# Patient Record
Sex: Male | Born: 1980 | Race: Black or African American | Hispanic: No | Marital: Single | Smoking: Former smoker
Health system: Southern US, Community
[De-identification: ages and names within clinical notes are randomized; demographics above are authoritative.]

## PROBLEM LIST (undated history)

## (undated) DIAGNOSIS — Z8669 Personal history of other diseases of the nervous system and sense organs: Secondary | ICD-10-CM

## (undated) DIAGNOSIS — Z8619 Personal history of other infectious and parasitic diseases: Secondary | ICD-10-CM

## (undated) DIAGNOSIS — K589 Irritable bowel syndrome without diarrhea: Secondary | ICD-10-CM

## (undated) HISTORY — DX: Irritable bowel syndrome without diarrhea: K58.9

## (undated) HISTORY — DX: Personal history of other diseases of the nervous system and sense organs: Z86.69

## (undated) HISTORY — DX: Personal history of other infectious and parasitic diseases: Z86.19

---

## 1998-07-23 ENCOUNTER — Emergency Department (HOSPITAL_COMMUNITY): Admission: EM | Admit: 1998-07-23 | Discharge: 1998-07-24 | Payer: Self-pay | Admitting: Emergency Medicine

## 1998-07-23 ENCOUNTER — Encounter: Payer: Self-pay | Admitting: Emergency Medicine

## 2000-03-09 ENCOUNTER — Encounter: Payer: Self-pay | Admitting: Emergency Medicine

## 2000-03-09 ENCOUNTER — Emergency Department (HOSPITAL_COMMUNITY): Admission: EM | Admit: 2000-03-09 | Discharge: 2000-03-10 | Payer: Self-pay | Admitting: Emergency Medicine

## 2000-03-18 ENCOUNTER — Emergency Department (HOSPITAL_COMMUNITY): Admission: EM | Admit: 2000-03-18 | Discharge: 2000-03-18 | Payer: Self-pay | Admitting: Emergency Medicine

## 2004-05-16 ENCOUNTER — Ambulatory Visit: Payer: Self-pay | Admitting: Internal Medicine

## 2004-06-12 ENCOUNTER — Ambulatory Visit: Payer: Self-pay | Admitting: Internal Medicine

## 2004-07-16 ENCOUNTER — Encounter: Admission: RE | Admit: 2004-07-16 | Discharge: 2004-07-16 | Payer: Self-pay | Admitting: Internal Medicine

## 2006-02-03 ENCOUNTER — Ambulatory Visit: Payer: Self-pay | Admitting: Internal Medicine

## 2010-05-19 ENCOUNTER — Encounter: Payer: Self-pay | Admitting: Internal Medicine

## 2010-05-20 ENCOUNTER — Encounter: Payer: Self-pay | Admitting: Internal Medicine

## 2010-05-20 ENCOUNTER — Ambulatory Visit (INDEPENDENT_AMBULATORY_CARE_PROVIDER_SITE_OTHER): Payer: 59 | Admitting: Internal Medicine

## 2010-05-20 VITALS — BP 110/70 | HR 72 | Ht 69.25 in | Wt 146.0 lb

## 2010-05-20 DIAGNOSIS — R209 Unspecified disturbances of skin sensation: Secondary | ICD-10-CM

## 2010-05-20 DIAGNOSIS — K589 Irritable bowel syndrome without diarrhea: Secondary | ICD-10-CM

## 2010-05-20 DIAGNOSIS — R200 Anesthesia of skin: Secondary | ICD-10-CM

## 2010-05-20 NOTE — Patient Instructions (Signed)
I think your symptoms could have been from compression of the stockings on  your legs down and crossing  Your legs over time.   I recommend wearing low socks that cannot compress your left leg overweight crossing her legs and putting pressure on the nerve.   If it is not continuing to get better over the next 2-3 weeks call and we may reevaluate.   If your swelling increases again despite elevating her legs at night also call or if you have redness or severe pain. Otherwise activity as normal.

## 2010-05-30 ENCOUNTER — Encounter: Payer: Self-pay | Admitting: Internal Medicine

## 2010-05-30 DIAGNOSIS — K589 Irritable bowel syndrome without diarrhea: Secondary | ICD-10-CM | POA: Insufficient documentation

## 2010-05-30 DIAGNOSIS — R200 Anesthesia of skin: Secondary | ICD-10-CM | POA: Insufficient documentation

## 2010-05-30 NOTE — Assessment & Plan Note (Signed)
This is possibly from local compression stockings and leg crossing no evidence of systemic neurologic disease or vascular obstruction at this time.

## 2010-05-30 NOTE — Progress Notes (Signed)
  Subjective:    Patient ID: Garrett Carlson, male    DOB: April 19, 1980, 30 y.o.   MRN: [id number]  HPI Patient comes in today to reestablish for an acute problem. His last visit with [place] was over six years ago.  He has generally been well and not had the need for  medical care. However over the last number of weeks he has had a problem with his left leg that he describes as tingling feeling tight mostly below his knee on the lateral side going down to his foot. There is no redness no specific injury noted. He has no history of blood clots and they do not run in his family. He has noticed that with his stockings on after work at times there is a band of fact that he can see on his skin. He's able to play basketball no specific injuries.  Sometimes when he is working sitting at station he does cross his legs a good bit. He has a history of intentional weight loss.   Review of Systems Negative for hearing vision chest pain shortness of breath he does have a history somehow takes an occasional mid-upper stomach ache from time to time that was diagnosed as IBS in the past he had used AcipHex for this to. History of migraines when he was younger none recently. Past Medical History  Diagnosis Date  . IBS (irritable bowel syndrome)   . Hx of varicella   . Hx of migraines    History reviewed. No pertinent past surgical history.  reports that he has quit smoking. He does not have any smokeless tobacco history on file. He reports that he does not drink alcohol or use illicit drugs. family history includes Arthritis in an unspecified family member and Breast cancer in an unspecified family member. No Known Allergies     Objective:   Physical Exam Well-developed well-nourished healthy appearing young adult no acute distress H EENT is grossly normal Neck no masses  Chest : CTA CV s1 s2 no g o m No clubbing cyanosis or edema  Extr: No focal atrophy or obvious edema he states left calf feels a bit  tight no rashes normal gait niche is good range of motion without fusion. No bony tenderness. Feet are normal without deformity. Gait  within normal limits        Assessment & Plan:  Left leg tingling Possibly compression of the anterior lateral  nerve when he crosses his legs with the feeling of swelling. He may have slight dependent edema at the end of the day. We discussed possibility of getting a Doppler to rule out venous clotting but I doubt if this is the problem  Disc avoiding leg crossing and compression in this area and see how this does .  He should fu if  persistent or progressive .   Or if new sx come on.  GI HX of IBS   Monitor and fu if  Recurring more frequently

## 2011-06-29 ENCOUNTER — Ambulatory Visit (INDEPENDENT_AMBULATORY_CARE_PROVIDER_SITE_OTHER): Payer: 59 | Admitting: Internal Medicine

## 2011-06-29 ENCOUNTER — Encounter: Payer: Self-pay | Admitting: Internal Medicine

## 2011-06-29 VITALS — BP 118/70 | HR 77 | Temp 97.7°F | Wt 153.0 lb

## 2011-06-29 DIAGNOSIS — M549 Dorsalgia, unspecified: Secondary | ICD-10-CM

## 2011-06-29 DIAGNOSIS — Z113 Encounter for screening for infections with a predominantly sexual mode of transmission: Secondary | ICD-10-CM

## 2011-06-29 DIAGNOSIS — Z202 Contact with and (suspected) exposure to infections with a predominantly sexual mode of transmission: Secondary | ICD-10-CM

## 2011-06-29 LAB — POCT URINALYSIS DIPSTICK
Bilirubin, UA: NEGATIVE
Glucose, UA: NEGATIVE
Ketones, UA: NEGATIVE
Leukocytes, UA: NEGATIVE
Nitrite, UA: NEGATIVE
Protein, UA: NEGATIVE
Spec Grav, UA: 1.02
Urobilinogen, UA: 0.2
pH, UA: 6.5

## 2011-06-29 MED ORDER — METRONIDAZOLE 500 MG PO TABS: 2000.0000 mg | ORAL_TABLET | Freq: Once | ORAL | Status: AC

## 2011-06-29 NOTE — Progress Notes (Signed)
  Subjective:    Patient ID: Garrett Carlson, male    DOB: 02-26-1980, 31 y.o.   MRN: [id number]  HPI Patient comes in today  for  new problem evaluation. Onset with awalening  Some LBP and then increasing slowly and last week slipped pushing a cart and aggravated it.  Then laid down and resting it.  And stretch at times.  NO hx or same   Except last year on back  [place] [employer].  4 hours at a time.  Split shift.  Spot sleep.  NO fever rash hematouria   Also concern about parter" having something and told  to get checked"  On other questioning this may be trichomonas  By hs he doesn't have uti sx or dc or hx of same and no other partners   Review of Systems No cp sob fever uti sx abd pain see hpi  No weakness or numbness now  iBS sx better?  Past history family history social history reviewed in the electronic medical record. No outpatient prescriptions prior to visit.       Objective:   Physical Exam BP 118/70  Pulse 77  Temp(Src) 97.7 F (36.5 C) (Oral)  Wt 153 lb (69.4 kg)  SpO2 98% WDWN in and HEENT grossly normal Neck: Supple without adenopathy or masses or bruits Chest:  Clear to A&P without wheezes rales or rhonchi CV:  S1-S2 no gallops or murmurs peripheral perfusion is normal BACK no midline tenderness  Para lumbar muscle area with mild spasm . Neg slr le weakness or rash . Toe heel walk is normal rom pain with over 90 degree flexion  Not extension.   SeeUA    Assessment & Plan:  Back pain  Low seems mechanical but hx context and exam without other alarm features   Disc  Exercises and rx and to fu if  persistent or progressive etc.  Screen sti exposure   Counseled. About all screening  Including blood tests for now will just do GC CHL and trich if available but  Even without sx  partners should be rx empirically so med given today with explanation and risk of getting again if reexposure to untreated person. Pt seems to understand this  Total visit >  50% spent counseling and coordinating care

## 2011-06-29 NOTE — Patient Instructions (Addendum)
This seems like  A mechanical  back pain and strain.  Attentive good back hygiene avoid prolonged sitting heavy bending and lifting until it gets better. You can use the local topicals that you are using and 2 Aleve twice a day if needed.  Back exercises might help but don't do them if they make things hurts worse.   Take metronidazole in case you have a Trichomonas infection.  Would like to know when laboratory tests are back. Trichomoniasis Trichomoniasis is an infection, caused by the Trichomonas organism, that affects both women and men. In women, the outer male genitalia and the vagina are affected. In men, the penis is mainly affected, but the prostate and other reproductive organs can also be involved. Trichomoniasis is a sexually transmitted disease (STD) and is most often passed to another person through sexual contact. The majority of people who get trichomoniasis do so from a sexual encounter and are also at risk for other STDs. CAUSES   Sexual intercourse with an infected partner.   It can be present in swimming pools or hot tubs.  SYMPTOMS   Abnormal gray-green frothy vaginal discharge in women.   Vaginal itching and irritation in women.   Itching and irritation of the area outside the vagina in women.   Penile discharge with or without pain in males.   Inflammation of the urethra (urethritis), causing painful urination.   Bleeding after sexual intercourse.  RELATED COMPLICATIONS  Pelvic inflammatory disease.   Infection of the uterus (endometritis).   Infertility.   Tubal (ectopic) pregnancy.   It can be associated with other STDs, including gonorrhea and chlamydia, hepatitis B, and HIV.  COMPLICATIONS DURING PREGNANCY  Early (premature) delivery.   Premature rupture of the membranes (PROM).   Low birth weight.  DIAGNOSIS   Visualization of Trichomonas under the microscope from the vagina discharge.   Ph of the vagina greater than 4.5, tested with a  test tape.   Trich Rapid Test.   Culture of the organism, but this is not usually needed.   It may be found on a Pap test.   Having a "strawberry cervix,"which means the cervix looks very red like a strawberry.  TREATMENT   You may be given medication to fight the infection. Inform your caregiver if you could be or are pregnant. Some medications used to treat the infection should not be taken during pregnancy.   Over-the-counter medications or creams to decrease itching or irritation may be recommended.   Your sexual partner will need to be treated if infected.  HOME CARE INSTRUCTIONS   Take all medication prescribed by your caregiver.   Take over-the-counter medication for itching or irritation as directed by your caregiver.   Do not have sexual intercourse while you have the infection.   Do not douche or wear tampons.   Discuss your infection with your partner, as your partner may have acquired the infection from you. Or, your partner may have been the person who transmitted the infection to you.   Have your sex partner examined and treated if necessary.   Practice safe, informed, and protected sex.   See your caregiver for other STD testing.  SEEK MEDICAL CARE IF:   You still have symptoms after you finish the medication.   You have an oral temperature above 102 F (38.9 C).   You develop belly (abdominal) pain.   You have pain when you urinate.   You have bleeding after sexual intercourse.   You develop a  rash.   The medication makes you sick or makes you throw up (vomit).  Document Released: 08/05/2000 Document Revised: 01/29/2011 Document Reviewed: 08/31/2008 [facility] Patient Information 2012 [place], [place].  Back Exercises These exercises may help you when beginning to rehabilitate your injury. Your symptoms may resolve with or without further involvement from your physician, physical therapist or athletic trainer. While completing these exercises,  remember:   Restoring tissue flexibility helps normal motion to return to the joints. This allows healthier, less painful movement and activity.   An effective stretch should be held for at least 30 seconds.   A stretch should never be painful. You should only feel a gentle lengthening or release in the stretched tissue.  STRETCH - Extension, Prone on Elbows   Lie on your stomach on the floor, a bed will be too soft. Place your palms about shoulder width apart and at the height of your head.   Place your elbows under your shoulders. If this is too painful, stack pillows under your chest.   Allow your body to relax so that your hips drop lower and make contact more completely with the floor.   Hold this position for __________ seconds.   Slowly return to lying flat on the floor.  Repeat __________ times. Complete this exercise __________ times per day.  RANGE OF MOTION - Extension, Prone Press Ups   Lie on your stomach on the floor, a bed will be too soft. Place your palms about shoulder width apart and at the height of your head.   Keeping your back as relaxed as possible, slowly straighten your elbows while keeping your hips on the floor. You may adjust the placement of your hands to maximize your comfort. As you gain motion, your hands will come more underneath your shoulders.   Hold this position __________ seconds.   Slowly return to lying flat on the floor.  Repeat __________ times. Complete this exercise __________ times per day.  RANGE OF MOTION- Quadruped, Neutral Spine   Assume a hands and knees position on a firm surface. Keep your hands under your shoulders and your knees under your hips. You may place padding under your knees for comfort.   Drop your head and point your tail bone toward the ground below you. This will round out your low back like an angry cat. Hold this position for __________ seconds.   Slowly lift your head and release your tail bone so that your back  sags into a large arch, like an old horse.   Hold this position for __________ seconds.   Repeat this until you feel limber in your low back.   Now, find your "sweet spot." This will be the most comfortable position somewhere between the two previous positions. This is your neutral spine. Once you have found this position, tense your stomach muscles to support your low back.   Hold this position for __________ seconds.  Repeat __________ times. Complete this exercise __________ times per day.  STRETCH - Flexion, Single Knee to Chest   Lie on a firm bed or floor with both legs extended in front of you.   Keeping one leg in contact with the floor, bring your opposite knee to your chest. Hold your leg in place by either grabbing behind your thigh or at your knee.   Pull until you feel a gentle stretch in your low back. Hold __________ seconds.   Slowly release your grasp and repeat the exercise with the opposite side.  Repeat __________ times. Complete this exercise __________ times per day.  STRETCH - Hamstrings, Standing  Stand or sit and extend your right / left leg, placing your foot on a chair or foot stool   Keeping a slight arch in your low back and your hips straight forward.   Lead with your chest and lean forward at the waist until you feel a gentle stretch in the back of your right / left knee or thigh. (When done correctly, this exercise requires leaning only a small distance.)   Hold this position for __________ seconds.  Repeat __________ times. Complete this stretch __________ times per day. STRENGTHENING - Deep Abdominals, Pelvic Tilt   Lie on a firm bed or floor. Keeping your legs in front of you, bend your knees so they are both pointed toward the ceiling and your feet are flat on the floor.   Tense your lower abdominal muscles to press your low back into the floor. This motion will rotate your pelvis so that your tail bone is scooping upwards rather than pointing at  your feet or into the floor.   With a gentle tension and even breathing, hold this position for __________ seconds.  Repeat __________ times. Complete this exercise __________ times per day.  STRENGTHENING - Abdominals, Crunches   Lie on a firm bed or floor. Keeping your legs in front of you, bend your knees so they are both pointed toward the ceiling and your feet are flat on the floor. Cross your arms over your chest.   Slightly tip your chin down without bending your neck.   Tense your abdominals and slowly lift your trunk high enough to just clear your shoulder blades. Lifting higher can put excessive stress on the low back and does not further strengthen your abdominal muscles.   Control your return to the starting position.  Repeat __________ times. Complete this exercise __________ times per day.  STRENGTHENING - Quadruped, Opposite UE/LE Lift   Assume a hands and knees position on a firm surface. Keep your hands under your shoulders and your knees under your hips. You may place padding under your knees for comfort.   Find your neutral spine and gently tense your abdominal muscles so that you can maintain this position. Your shoulders and hips should form a rectangle that is parallel with the floor and is not twisted.   Keeping your trunk steady, lift your right hand no higher than your shoulder and then your left leg no higher than your hip. Make sure you are not holding your breath. Hold this position __________ seconds.   Continuing to keep your abdominal muscles tense and your back steady, slowly return to your starting position. Repeat with the opposite arm and leg.  Repeat __________ times. Complete this exercise __________ times per day. Document Released: 02/27/2005 Document Revised: 01/29/2011 Document Reviewed: 05/24/2008 [facility] Patient Information 2012 [place], [place].

## 2011-06-30 LAB — GC/CHLAMYDIA PROBE AMP, URINE
Chlamydia, Swab/Urine, PCR: NEGATIVE
GC Probe Amp, Urine: NEGATIVE

## 2011-07-04 DIAGNOSIS — Z202 Contact with and (suspected) exposure to infections with a predominantly sexual mode of transmission: Secondary | ICD-10-CM | POA: Insufficient documentation

## 2011-07-04 DIAGNOSIS — M549 Dorsalgia, unspecified: Secondary | ICD-10-CM | POA: Insufficient documentation

## 2011-07-04 DIAGNOSIS — Z113 Encounter for screening for infections with a predominantly sexual mode of transmission: Secondary | ICD-10-CM | POA: Insufficient documentation

## 2011-07-08 NOTE — Progress Notes (Signed)
Quick Note:  Spoke with pt- informed of results and dr. Rosezella [place] instructions . Pt is just picking up med today - encouraged him to take med today. Still waiting for 1 more resutls. Verbalized understanding . ______

## 2016-10-20 ENCOUNTER — Encounter: Payer: Self-pay | Admitting: Family Medicine

## 2016-10-20 ENCOUNTER — Ambulatory Visit (INDEPENDENT_AMBULATORY_CARE_PROVIDER_SITE_OTHER): Payer: BLUE CROSS/BLUE SHIELD | Admitting: Family Medicine

## 2016-10-20 VITALS — BP 118/70 | HR 61 | Resp 12 | Ht 69.25 in | Wt 150.5 lb

## 2016-10-20 DIAGNOSIS — R053 Chronic cough: Secondary | ICD-10-CM

## 2016-10-20 DIAGNOSIS — R05 Cough: Secondary | ICD-10-CM | POA: Diagnosis not present

## 2016-10-20 DIAGNOSIS — R51 Headache: Secondary | ICD-10-CM | POA: Diagnosis not present

## 2016-10-20 DIAGNOSIS — Z202 Contact with and (suspected) exposure to infections with a predominantly sexual mode of transmission: Secondary | ICD-10-CM | POA: Diagnosis not present

## 2016-10-20 DIAGNOSIS — M79645 Pain in left finger(s): Secondary | ICD-10-CM | POA: Diagnosis not present

## 2016-10-20 DIAGNOSIS — R519 Headache, unspecified: Secondary | ICD-10-CM

## 2016-10-20 DIAGNOSIS — J309 Allergic rhinitis, unspecified: Secondary | ICD-10-CM

## 2016-10-20 DIAGNOSIS — M654 Radial styloid tenosynovitis [de Quervain]: Secondary | ICD-10-CM

## 2016-10-20 MED ORDER — METRONIDAZOLE 500 MG PO TABS: 2000.0000 mg | ORAL_TABLET | Freq: Once | ORAL | 0 refills | Status: AC

## 2016-10-20 MED ORDER — MELOXICAM 7.5 MG PO TABS: 7.5000 mg | ORAL_TABLET | Freq: Every day | ORAL | 0 refills | Status: AC

## 2016-10-20 MED ORDER — FLUTICASONE PROPIONATE 50 MCG/ACT NA SUSP: 1.0000 | Freq: Two times a day (BID) | NASAL | 3 refills | Status: DC

## 2016-10-20 NOTE — Progress Notes (Signed)
HPI:   [name] is a 36 y.o. male, who is here today to establish care with me.  Former PCP: N/A Last preventive routine visit: 5-6 years ago.  Chronic medical problems: Chronic bilateral shoulder pain, frequent headaches, and migraines among some. He states that he has not had a migraine in many years. He lives with a friend. He follows a healthy diet but does not exercise regularly.   Concerns today:   He has a list of concerns, states that he has not seen a doctor in 5 years.  STD testing, according to patient, he was exposed to Trichomonas about 2-3 weeks ago. He denies any urethral discharge, dysuria, genital lesions, or abdominal pain. He doesn't wear condoms consistently. He is reporting HIV test done last year and negative.  Left finger pain and left wrist pain: Left middle finger pain for about a month, he denies any injury. Burning/sore pain, mild limitation of flexion, 7-8/10 and getting worse. He is right-handed. He has not tried OTC medication.  Left wrist pain for about 2 weeks,radial aspect, chart, 8/10. Pain is exacerbated by certain activities. This problem is otherwise stable.  When asked about associated fatigue, she states that he is "always tired."   Headache: Frontal pressure headache, which has been going on for a while. According to patient, in the past when he had this headache it is because he needed to change eyeglasses prescription. He has eye exam recently and wearing new glasses. This headache is mild. He denies associated nausea, vomiting, or orthopnea. He has history of migraines, has not had one in many years.   Allergies.  Currently he is not on treatment. According to patient, allergies have been "bad" year. Currently he is not having this congestion or rhinorrhea. He denies fever, chills, or body aches. He is not on treatment.  Coughing "for months". 02/22/17 stopped smoking. He denies associated chest pain,  wheezing, or dyspnea. He denies hemoptysis or abnormal weight loss. He has not tried OTC medication. He denies history of GERD or heartburn.   Review of Systems  Constitutional: Positive for fatigue. Negative for appetite change, chills, diaphoresis, fever and unexpected weight change.  HENT: Negative for dental problem, facial swelling, nosebleeds, rhinorrhea, sinus pain and sore throat.   Eyes: Negative for photophobia, pain and visual disturbance.  Respiratory: Positive for cough. Negative for shortness of breath and wheezing.   Cardiovascular: Negative for chest pain, palpitations and leg swelling.  Gastrointestinal: Negative for abdominal pain, nausea and vomiting.       No changes in bowel habits.  Genitourinary: Negative for decreased urine volume, discharge, dysuria, hematuria, penile swelling and testicular pain.  Musculoskeletal: Positive for arthralgias. Negative for gait problem and myalgias.  Skin: Negative for pallor and rash.  Neurological: Positive for headaches. Negative for seizures.  Hematological: Negative for adenopathy. Does not bruise/bleed easily.  Psychiatric/Behavioral: Negative for confusion and sleep disturbance. The patient is nervous/anxious.      No current outpatient prescriptions on file prior to visit.   No current facility-administered medications on file prior to visit.      Past Medical History:  Diagnosis Date  . Hx of migraines   . Hx of varicella   . IBS (irritable bowel syndrome)    No Known Allergies  Family History  Problem Relation Age of Onset  . Breast cancer Unknown   . Arthritis Unknown        grandmother- maternal  . Arthritis Maternal Grandmother   .  Stroke Maternal Grandfather     Social History   Social History  . Marital status: Single    Spouse name: N/A  . Number of children: N/A  . Years of education: N/A   Social History Main Topics  . Smoking status: Former Games developer  . Smokeless tobacco: Never Used  .  Alcohol use No  . Drug use: No  . Sexual activity: Not Asked   Other Topics Concern  . None   Social History Narrative   College degree    works for [employer]   Single has a roommate   Neg TAD some caffeine    Smoke alarm FIrearms    Vitals:   10/20/16 1056  BP: 118/70  Pulse: 61  Resp: 12  SpO2: 99%    Body mass index is 22.06 kg/m.   Physical Exam  Nursing note and vitals reviewed. Constitutional: He is oriented to person, place, and time. He appears well-developed and well-nourished. No distress.  HENT:  Head: Normocephalic and atraumatic.  Nose: Septal deviation present.  Mouth/Throat: Oropharynx is clear and moist and mucous membranes are normal.  Hypertrophic turbinates.  Eyes: Pupils are equal, round, and reactive to light. Conjunctivae and EOM are normal.  Neck: No tracheal deviation present. No thyroid mass and no thyromegaly present.  Cardiovascular: Normal rate and regular rhythm.   No murmur heard. Pulses:      Dorsalis pedis pulses are 2+ on the right side, and 2+ on the left side.  Respiratory: Effort normal and breath sounds normal. No respiratory distress.  GI: Soft. He exhibits no mass. There is no hepatomegaly. There is no tenderness.  Genitourinary: No penile tenderness. No discharge found.  Genitourinary Comments: Urethral swab collected.  Musculoskeletal: He exhibits no edema.  Left:Tenderness upon palpation of radial styloid, no edema or erythema appreciated, no limitation of wrist ROM. Pain also elicited on radial styloid with Finkelstein maneuver. Pain upon palpation of radial aspect PIP joint 3rd left finger. Mild limitation of flexion, elicits pain.  Shoulders: Pain elicited with movement, no significant limitation of ROM.  No signs of synovitis or significant deformities.   Lymphadenopathy:    He has no cervical adenopathy.       Right: No inguinal adenopathy present.       Left: No inguinal adenopathy present.  Neurological:  He is alert and oriented to person, place, and time. He has normal strength. Coordination and gait normal.  Skin: Skin is warm. No rash noted. No erythema.  Psychiatric: His mood appears anxious.  Well groomed, good eye contact.     ASSESSMENT AND PLAN:   Mr. Garrett Carlson was seen today for establish care.  Diagnoses and all orders for this visit:  Persistent cough  Possible etiologies discussed, including infectious,GI,allergies among some. Today auscultation negative. Further recommendations will be given according to imaging results.   -     DG Chest 2 View; Future  Trichomonas exposure  Refused HIV today, reports having one a year ago and negative. STD prevention discussed. Metronidazole side effects discussed. Further recommendations will be given according to lab results.  -     Chlamydia/Gonococcus/Trichomonas, NAA -     metroNIDAZOLE (FLAGYL) 500 MG tablet; Take 4 tablets (2,000 mg total) by mouth once.  De Quervain's tenosynovitis, left  Educated about Dx and treatment options. Mobic side effects discussed. Referred to Sport medication to discuss treatment options.  -     Ambulatory referral to Sports Medicine -     meloxicam (  MOBIC) 7.5 MG tablet; Take 1-2 tablets (7.5-15 mg total) by mouth daily.  Finger pain, left  ? Sprain finger. Mobid 7.5-15 mg daily for 7-10 days. F/U as needed.  -     Ambulatory referral to Sports Medicine -     meloxicam (MOBIC) 7.5 MG tablet; Take 1-2 tablets (7.5-15 mg total) by mouth daily.  Frontal headache  ? Allergies,?tension headache. Instructed about warning signs. I do not think imaging is needed today.  F/U if not better with allergic rhinitis treatment.  Allergic rhinitis, unspecified seasonality, unspecified trigger  OTC antihistaminic recommended. Flonase nasal spray. F/U as needed.  -     fluticasone (FLONASE) 50 MCG/ACT nasal spray; Place 1 spray into both nostrils 2 (two) times daily.     Betty G.  [place], MD  [facility]. [place] office.

## 2016-10-20 NOTE — Patient Instructions (Signed)
A few things to remember from today's visit:   Persistent cough - Plan: DG Chest 2 View  Trichomonas exposure - Plan: Chlamydia/Gonococcus/Trichomonas, NAA, metroNIDAZOLE (FLAGYL) 500 MG tablet  De Quervain's tenosynovitis, left - Plan: Ambulatory referral to Sports Medicine, meloxicam (MOBIC) 7.5 MG tablet  Finger pain, left - Plan: Ambulatory referral to Sports Medicine, meloxicam (MOBIC) 7.5 MG tablet  Frontal headache  Allergic rhinitis, unspecified seasonality, unspecified trigger - Plan: fluticasone (FLONASE) 50 MCG/ACT nasal spray  Trichomonas Test Why am I having this test? The trichomonas test is done to diagnose trichomoniasis, an infection caused by an organism called Trichomonas. Trichomoniasis is a sexually transmitted infection (STI). In women, it causes vaginal infections. In men, it can cause the tube that carries urine (urethra) to become inflamed (urethritis). You may have this test as a part of a routine screening for STIs or if you have symptoms of trichomoniasis. To perform the test, your health care provider will take a sample of discharge. The sample is taken from the vagina or cervix in women and from the urethra in men. A urine sample can also be used for testing. What do the results mean? It is your responsibility to obtain your test results. Ask the lab or department performing the test when and how you will get your results. Contact your health care provider to discuss any questions you have about your results. Negative test results A negative test means you do not have trichomoniasis. Follow your health care provider's directions about any follow-up testing. Positive test results A positive test result means you have an active infection that needs to be treated with antibiotic medicine. All your current sexual partners must also be treated or it is likely you will get reinfected. If your test is positive, your health care provider will start you on medicine and  may advise you to:  Not have sexual intercourse until your infection has cleared up.  Use a latex condom properly every time you have sexual intercourse.  Limit the number of sexual partners you have. The more partners you have, the greater your risk of contracting trichomoniasis or another STI.  Tell all sexual partners about your infection so they can also be treated and to prevent reinfection.  Talk with your health care provider to discuss your results, treatment options, and if necessary, the need for more tests. Talk with your health care provider if you have any questions about your results. This information is not intended to replace advice given to you by your health care provider. Make sure you discuss any questions you have with your health care provider. Document Released: 03/14/2004 Document Revised: 09/11/2015 Document Reviewed: 02/21/2013 Elsevier Interactive Patient Education  2017 Elsevier Inc.  Please be sure medication list is accurate. If a new problem present, please set up appointment sooner than planned today.

## 2016-10-21 ENCOUNTER — Encounter: Payer: Self-pay | Admitting: Family Medicine

## 2016-10-22 LAB — CHLAMYDIA/GONOCOCCUS/TRICHOMONAS, NAA
Chlamydia by NAA: NEGATIVE
Gonococcus by NAA: NEGATIVE
Trich vag by NAA: POSITIVE — AB

## 2016-10-30 ENCOUNTER — Ambulatory Visit: Payer: BLUE CROSS/BLUE SHIELD | Admitting: Family Medicine

## 2016-11-06 ENCOUNTER — Encounter: Payer: Self-pay | Admitting: Sports Medicine

## 2016-11-06 ENCOUNTER — Ambulatory Visit: Payer: BLUE CROSS/BLUE SHIELD | Admitting: Family Medicine

## 2016-11-06 ENCOUNTER — Other Ambulatory Visit: Payer: Self-pay | Admitting: Family Medicine

## 2016-11-06 ENCOUNTER — Ambulatory Visit (INDEPENDENT_AMBULATORY_CARE_PROVIDER_SITE_OTHER): Payer: BLUE CROSS/BLUE SHIELD

## 2016-11-06 ENCOUNTER — Ambulatory Visit (INDEPENDENT_AMBULATORY_CARE_PROVIDER_SITE_OTHER): Payer: BLUE CROSS/BLUE SHIELD | Admitting: Sports Medicine

## 2016-11-06 VITALS — BP 108/76 | HR 78 | Ht 69.25 in | Wt 147.0 lb

## 2016-11-06 DIAGNOSIS — M2550 Pain in unspecified joint: Secondary | ICD-10-CM

## 2016-11-06 DIAGNOSIS — K589 Irritable bowel syndrome without diarrhea: Secondary | ICD-10-CM

## 2016-11-06 DIAGNOSIS — M549 Dorsalgia, unspecified: Secondary | ICD-10-CM

## 2016-11-06 DIAGNOSIS — M79641 Pain in right hand: Secondary | ICD-10-CM | POA: Diagnosis not present

## 2016-11-06 DIAGNOSIS — M79642 Pain in left hand: Secondary | ICD-10-CM | POA: Diagnosis not present

## 2016-11-06 DIAGNOSIS — Z8739 Personal history of other diseases of the musculoskeletal system and connective tissue: Secondary | ICD-10-CM | POA: Diagnosis not present

## 2016-11-06 DIAGNOSIS — M654 Radial styloid tenosynovitis [de Quervain]: Secondary | ICD-10-CM

## 2016-11-06 DIAGNOSIS — M79645 Pain in left finger(s): Secondary | ICD-10-CM

## 2016-11-06 MED ORDER — DICLOFENAC SODIUM 2 % TD SOLN: 1.0000 "application " | Freq: Two times a day (BID) | TRANSDERMAL | 0 refills | Status: AC

## 2016-11-06 MED ORDER — DICLOFENAC SODIUM 2 % TD SOLN: 1.0000 "application " | Freq: Two times a day (BID) | TRANSDERMAL | 2 refills | Status: DC

## 2016-11-06 NOTE — Progress Notes (Signed)
OFFICE VISIT NOTE Garrett Carlson. [name] at [facility] [phone]  Garrett Carlson - 36 y.o. male MRN [id number]  Date of birth: [birth year]  Visit Date: 11/06/2016  PCP: Madelin Headings, MD   Referred by: Madelin Headings, MD  Orlie Dakin, CMA acting as scribe for Dr. Berline Chough.  SUBJECTIVE:   Chief Complaint  Patient presents with  . New Patient (Initial Visit)    finger pain   HPI: As below and per problem based documentation when appropriate.  Garrett Carlson is a new patient presenting today for evaluation of finger pain, LT hand. Pain is located around the 2nd and 3rd finger of the LT hand. He also c/o pain in the LT wrist. He hasn't noticed any swelling in the wrist, maybe some slight swelling around the 3rd finger.  Pain has been present x 1 month.  No known injury or trauma.   The pain is described as constant aching with occasional burning sensation. Pain is rated as 5/10.  Worsened with use, holding things, grabbing packages. He was pushing on the knuckle of the third finger and all of a sudden his hand froze.  Nothing seems to help alleviate the pain.  Therapies tried include : He has tried massaging the finger/wrist with no relief. He has tried taking Meloxicam with no relief.   Other associated symptoms include: He reports increased weakness in the hand daily. Pain radiates from the fingers through the hand and toward the wrist. No radiation of pain into the arm. He does report occasional radiating pain in the elbow when leaning on it. He denies neck pain  No recent xray    Review of Systems  Constitutional: Negative for chills and fever.  Respiratory: Negative for shortness of breath and wheezing.   Cardiovascular: Negative for chest pain and palpitations.  Musculoskeletal: Positive for falls and joint pain.  Neurological: Positive for tingling and headaches. Negative for dizziness.  Endo/Heme/Allergies: Does not  bruise/bleed easily.    Otherwise per HPI.  HISTORY & PERTINENT PRIOR DATA:  No specialty comments available. He reports that he has quit smoking. He has never used smokeless tobacco.   Recent Labs  11/06/16 1100  LABURIC 4.2   Medications & Allergies reviewed per EMR Patient Active Problem List   Diagnosis Date Noted  . 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 Polyarthralgia 11/06/2016  . Contact with and suspected exposure to infections with predominantly sexual mode of transmission 07/04/2011  . Routine screening for STI (sexually transmitted infection) 07/04/2011  . Back pain 07/04/2011  . Numbness in left leg 05/30/2010  . IBS (irritable bowel syndrome)    Past Medical History:  Diagnosis Date  . Hx of migraines   . Hx of varicella   . IBS (irritable bowel syndrome)    Family History  Problem Relation Age of Onset  . Breast cancer Unknown   . Arthritis Unknown        grandmother- maternal  . Arthritis Maternal Grandmother   . Stroke Maternal Grandfather    No past surgical history on file. Social History   Occupational History  . Not on file.   Social History Main Topics  . Smoking status: Former Games developer  . Smokeless tobacco: Never Used  . Alcohol use No  . Drug use: No  . Sexual activity: Not on file    OBJECTIVE:  VS:  HT:5' 9.25" (175.9 cm)   WT:147 lb (66.7 kg)  BMI:21.55  BP:108/76  HR:78bpm  TEMP: ( )  RESP:98 % EXAM: Findings:  WDWN, NAD, Non-toxic appearing Alert & appropriately interactive Not depressed or anxious appearing No increased work of breathing. Pupils are equal. EOM intact without nystagmus No clubbing or cyanosis of the extremities appreciated No significant rashes/lesions/ulcerations overlying the examined area. Radial pulses 2+/4.  No significant generalized UE edema.  Does have a small amount of swelling over his bilateral hands no appreciable measurable amount. Sensation intact to light touch in upper  extremities.  Bilateral hands are overall well aligned but he does have a small amount of bossing of the PIPs.  He has full flexion extension of the fingers although he reports pain with this.  His grip strength is intact.  Wrists do feel as though they have a small amount of swelling in them but this is minimal.  No significant pain with flexion, extension, radial deviation or ulnar deviation.  Mild review of his x-rays do show that he has squaring of the PIPs with a small erosion of the second PIP on the right hand.  Findings are consistent with potential early, inflammatory arthropathy     Dg Hand Complete Left  Result Date: 11/06/2016 CLINICAL DATA:  Left middle finger pain for 1 month without known injury. EXAM: LEFT HAND - COMPLETE 3+ VIEW COMPARISON:  None. FINDINGS: There is no evidence of fracture or dislocation. There is no evidence of arthropathy or other focal bone abnormality. Soft tissues are unremarkable. IMPRESSION: Normal left hand. Electronically Signed   By: Lupita Raider, M.D.   On: 11/06/2016 11:35   Dg Hand Complete Right  Result Date: 11/06/2016 CLINICAL DATA:  Polyarthralgia. EXAM: RIGHT HAND - COMPLETE 3+ VIEW COMPARISON:  None. FINDINGS: There is no evidence of fracture or dislocation. There is no evidence of arthropathy or other focal bone abnormality. Soft tissues are unremarkable. IMPRESSION: Normal right hand. Electronically Signed   By: Lupita Raider, M.D.   On: 11/06/2016 11:34   ASSESSMENT & PLAN:     ICD-10-CM   1. 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 Polyarthralgia M25.50 Rheumatoid factor    Cyclic citrul peptide antibody, IgG    ANA    DG Hand Complete Left    DG Hand Complete Right    CBC with Differential/Platelet    CK    Comprehensive metabolic panel    Sedimentation rate    Uric acid    C-reactive protein    CANCELED: Comprehensive metabolic panel    CANCELED: Uric acid    CANCELED: CBC with Differential/Platelet    CANCELED: Sedimentation  rate    CANCELED: C-reactive protein    CANCELED: CK  3. Back pain, unspecified back location, unspecified back pain laterality, unspecified chronicity M54.9   4. Irritable bowel syndrome, unspecified type K58.9    ================================================================= Polyarthralgia His symptoms are concerning for potential inflammatory polyarthropathy.  His x-rays did have some findings that do suggest a small amount of erosion within the PIPs with early squaring of the joints.  The clinical picture is concerning for potential inflammatory polyarthropathy and will check basic blood work today to evaluate for this.  If lab work does reveal inflammatory polyarthropathy will need referral to rheumatology, otherwise we will plan to follow-up with him myself he can consider diagnostic MSK ultrasound if symptoms seem to localize.  >50% of this 30 minute visit spent in direct patient counseling and/or coordination of care.  Discussion was focused on education regarding the in discussing the  pathoetiology and anticipated clinical course of the above condition.  History of acute JRA Could consider referral to rheumatology even with normal lab evaluation.  Will need to discuss this at follow-up.   ================================================================= Follow-up: Return in about 4 weeks (around 12/04/2016).   CMA/ATC served as Neurosurgeon during this visit. History, Physical, and Plan performed by medical provider. Documentation and orders reviewed and attested to.      Gaspar Bidding, DO    [facility] Physician

## 2016-11-09 LAB — COMPREHENSIVE METABOLIC PANEL
AG RATIO: 1.8 (calc) (ref 1.0–2.5)
ALT: 12 U/L (ref 9–46)
AST: 18 U/L (ref 10–40)
Albumin: 4.6 g/dL (ref 3.6–5.1)
Alkaline phosphatase (APISO): 52 U/L (ref 40–115)
BILIRUBIN TOTAL: 0.3 mg/dL (ref 0.2–1.2)
BUN: 13 mg/dL (ref 7–25)
CALCIUM: 9.7 mg/dL (ref 8.6–10.3)
CHLORIDE: 105 mmol/L (ref 98–110)
CO2: 27 mmol/L (ref 20–32)
Creat: 1.14 mg/dL (ref 0.60–1.35)
GLOBULIN: 2.6 g/dL (ref 1.9–3.7)
GLUCOSE: 101 mg/dL — AB (ref 65–99)
POTASSIUM: 4.1 mmol/L (ref 3.5–5.3)
SODIUM: 139 mmol/L (ref 135–146)
TOTAL PROTEIN: 7.2 g/dL (ref 6.1–8.1)

## 2016-11-09 LAB — CBC WITH DIFFERENTIAL/PLATELET
BASOS ABS: 61 {cells}/uL (ref 0–200)
Basophils Relative: 0.7 %
EOS PCT: 2 %
Eosinophils Absolute: 174 cells/uL (ref 15–500)
HEMATOCRIT: 43.8 % (ref 38.5–50.0)
HEMOGLOBIN: 14.8 g/dL (ref 13.2–17.1)
LYMPHS ABS: 2932 {cells}/uL (ref 850–3900)
MCH: 31 pg (ref 27.0–33.0)
MCHC: 33.8 g/dL (ref 32.0–36.0)
MCV: 91.6 fL (ref 80.0–100.0)
MPV: 10 fL (ref 7.5–12.5)
Monocytes Relative: 7.4 %
NEUTROS ABS: 4889 {cells}/uL (ref 1500–7800)
NEUTROS PCT: 56.2 %
Platelets: 307 10*3/uL (ref 140–400)
RBC: 4.78 10*6/uL (ref 4.20–5.80)
RDW: 12.6 % (ref 11.0–15.0)
Total Lymphocyte: 33.7 %
WBC mixed population: 644 cells/uL (ref 200–950)
WBC: 8.7 10*3/uL (ref 3.8–10.8)

## 2016-11-09 LAB — RHEUMATOID FACTOR

## 2016-11-09 LAB — URIC ACID: URIC ACID, SERUM: 4.2 mg/dL (ref 4.0–8.0)

## 2016-11-09 LAB — SEDIMENTATION RATE: Sed Rate: 2 mm/h (ref 0–15)

## 2016-11-09 LAB — C-REACTIVE PROTEIN: CRP: 0.7 mg/L (ref <8.0)

## 2016-11-09 LAB — CYCLIC CITRUL PEPTIDE ANTIBODY, IGG

## 2016-11-09 LAB — ANA: ANA: NEGATIVE

## 2016-11-09 LAB — CK: Total CK: 208 U/L — ABNORMAL HIGH (ref 44–196)

## 2016-11-23 NOTE — Assessment & Plan Note (Signed)
His symptoms are concerning for potential inflammatory polyarthropathy.  His x-rays did have some findings that do suggest a small amount of erosion within the PIPs with early squaring of the joints.  The clinical picture is concerning for potential inflammatory polyarthropathy and will check basic blood work today to evaluate for this.  If lab work does reveal inflammatory polyarthropathy will need referral to rheumatology, otherwise we will plan to follow-up with him myself he can consider diagnostic MSK ultrasound if symptoms seem to localize.  >50% of this 30 minute visit spent in direct patient counseling and/or coordination of care.  Discussion was focused on education regarding the in discussing the pathoetiology and anticipated clinical course of the above condition.

## 2016-11-23 NOTE — Assessment & Plan Note (Addendum)
Could consider referral to rheumatology even with normal lab evaluation.  Will need to discuss this at follow-up.

## 2016-12-07 ENCOUNTER — Encounter: Payer: Self-pay | Admitting: Sports Medicine

## 2016-12-07 ENCOUNTER — Ambulatory Visit (INDEPENDENT_AMBULATORY_CARE_PROVIDER_SITE_OTHER): Payer: BLUE CROSS/BLUE SHIELD | Admitting: Sports Medicine

## 2016-12-07 VITALS — BP 102/74 | HR 82 | Ht 69.25 in | Wt 147.0 lb

## 2016-12-07 DIAGNOSIS — M79645 Pain in left finger(s): Secondary | ICD-10-CM

## 2016-12-07 DIAGNOSIS — M2550 Pain in unspecified joint: Secondary | ICD-10-CM

## 2016-12-07 DIAGNOSIS — Z8739 Personal history of other diseases of the musculoskeletal system and connective tissue: Secondary | ICD-10-CM | POA: Diagnosis not present

## 2016-12-07 NOTE — Progress Notes (Signed)
OFFICE VISIT NOTE Veverly Fells. [name] at [facility] [phone]  Tahjae MOUSTAFA MOSSA - 36 y.o. male MRN [id number]  Date of birth: 1980-08-16  Visit Date: 12/07/2016  PCP: Madelin Headings, MD   Referred by: Madelin Headings, MD  Orlie Dakin, CMA acting as scribe for Dr. Berline Chough.  SUBJECTIVE:   Chief Complaint  Patient presents with  . Follow-up    LT hand pain   HPI: As below and per problem based documentation when appropriate.  Garrett Carlson is an established patient presenting today in follow-up of polyarthralgia, LT hand and wrist pain. He was last seen 11/06/16. She has been advised to use topical Pennsaid. She had labs done while came back showing isolated CK elevation, otherwise normal serology.   He has been using Pennsaid with some relief. He feels that pain has improved but has not resolved. The finger is no longer tender to palpation. He has a constant dull ache which is worse when making a fist. He is unable to completely bend the middle finger. He has not noticed any swelling around the joint since his last visit. He is not taking any oral medications or immobilizing the finger. He has noticed some weakness in the LT hand but its not as severe as it was at his last visit.     Review of Systems  Constitutional: Negative for chills and fever.  Respiratory: Negative for shortness of breath and wheezing.   Cardiovascular: Negative for chest pain and palpitations.  Musculoskeletal: Positive for joint pain.  Neurological: Positive for headaches. Negative for dizziness and tingling.    Otherwise per HPI.  HISTORY & PERTINENT PRIOR DATA:  No specialty comments available. He reports that he has quit smoking. He has never used smokeless tobacco.   Recent Labs  11/06/16 1100  LABURIC 4.2   Allergies reviewed per EMR Prior to Admission medications   Medication Sig Start Date End Date Taking? Authorizing Provider    Diclofenac Sodium (PENNSAID) 2 % SOLN Place 1 application onto the skin 2 (two) times daily. 11/06/16  Yes Andrena Mews, DO  fluticasone (FLONASE) 50 MCG/ACT nasal spray Place 1 spray into both nostrils 2 (two) times daily. 10/20/16  Yes [place], Betty G, MD   Patient Active Problem List   Diagnosis Date Noted  . Finger pain, left 12/07/2016  . 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 Polyarthralgia 11/06/2016  . Contact with and suspected exposure to infections with predominantly sexual mode of transmission 07/04/2011  . Routine screening for STI (sexually transmitted infection) 07/04/2011  . Back pain 07/04/2011  . Numbness in left leg 05/30/2010  . IBS (irritable bowel syndrome)    Past Medical History:  Diagnosis Date  . Hx of migraines   . Hx of varicella   . IBS (irritable bowel syndrome)    Family History  Problem Relation Age of Onset  . Breast cancer Unknown   . Arthritis Unknown        grandmother- maternal  . Arthritis Maternal Grandmother   . Stroke Maternal Grandfather    No past surgical history on file. Social History   Occupational History  . Not on file.   Social History Main Topics  . Smoking status: Former Games developer  . Smokeless tobacco: Never Used  . Alcohol use No  . Drug use: No  . Sexual activity: Not on file    OBJECTIVE:  VS:  HT:5' 9.25" (175.9 cm)  WT:147 lb (66.7 kg)  BMI:21.55    BP:102/74  HR:82bpm  TEMP: ( )  RESP:97 % EXAM: Findings:  Hands are overall well aligned.  He has no focal synovitis of the wrist but he does have a small amount of swelling of the third MCP and PIP on the left hand.  There is a small amount of synovitis with palpation.  He is ligamentously stable.  No surrounding skin changes.  No focal wrist pain bilaterally.  He has approximately 10 degrees of flexion discrepancy between his right third finger and his left third finger.  Skin however ligamentously stable.    RADIOLOGY: X-rays of bilateral hands reviewed from  last office visit, they were read as normal by radiology but he does have squaring of the distal aspect of the metacarpals as well as proximal phalanges with small erosions especially over the area that he is most symptomatic on the third PIP of the left hand.  ASSESSMENT & PLAN:     ICD-10-CM   1. Polyarthralgia M25.50   2. 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 Finger pain, left M79.645    ================================================================= Finger pain, left Focal swelling with synovitis of the left third PIP.  Responding well to topical Pennsaid and recommend he use it twice per day over the next several weeks to ensure clinical resolution prior to discontinuation.  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   I would like to check in with him in 3 months to ensure clinical resolution and if any evidence of persistent synovitis on exam referral to rheumatology should be discussed again at that time.  Serology is reassuring for no acute inflammatory component or active disease.  >50% of this 25 minute visit spent in direct patient counseling and/or coordination of care.  Discussion was focused on education regarding the in discussing the pathoetiology and anticipated clinical course of the above condition.   =================================================================  Follow-up: Return in about 3 months (around 03/09/2017).   CMA/ATC served as Neurosurgeon during this visit. History, Physical, and Plan performed by medical provider. Documentation and orders reviewed and attested to.      Gaspar Bidding, DO    [facility] Phys[facility]

## 2016-12-07 NOTE — Assessment & Plan Note (Addendum)
Focal swelling with synovitis of the left third PIP.  Responding well to topical Pennsaid and recommend he use it twice per day over the next several weeks to ensure clinical resolution prior to discontinuation.  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   I would like to check in with him in 3 months to ensure clinical resolution and if any evidence of persistent synovitis on exam referral to rheumatology should be discussed again at that time.  Serology is reassuring for no acute inflammatory component or active disease.  >50% of this 25 minute visit spent in direct patient counseling and/or coordination of care.  Discussion was focused on education regarding the in discussing the pathoetiology and anticipated clinical course of the above condition.

## 2017-03-09 ENCOUNTER — Ambulatory Visit: Payer: BLUE CROSS/BLUE SHIELD | Admitting: Sports Medicine

## 2017-03-09 DIAGNOSIS — Z0289 Encounter for other administrative examinations: Secondary | ICD-10-CM

## 2017-03-26 ENCOUNTER — Encounter: Payer: Self-pay | Admitting: Sports Medicine

## 2017-05-19 DIAGNOSIS — L638 Other alopecia areata: Secondary | ICD-10-CM | POA: Diagnosis not present

## 2017-06-10 DIAGNOSIS — M25571 Pain in right ankle and joints of right foot: Secondary | ICD-10-CM | POA: Diagnosis not present

## 2017-06-10 DIAGNOSIS — M25572 Pain in left ankle and joints of left foot: Secondary | ICD-10-CM | POA: Diagnosis not present

## 2017-06-22 DIAGNOSIS — L638 Other alopecia areata: Secondary | ICD-10-CM | POA: Diagnosis not present

## 2017-09-09 DIAGNOSIS — L84 Corns and callosities: Secondary | ICD-10-CM | POA: Diagnosis not present

## 2017-09-09 DIAGNOSIS — L638 Other alopecia areata: Secondary | ICD-10-CM | POA: Diagnosis not present

## 2017-09-09 DIAGNOSIS — R208 Other disturbances of skin sensation: Secondary | ICD-10-CM | POA: Diagnosis not present

## 2017-10-14 DIAGNOSIS — L638 Other alopecia areata: Secondary | ICD-10-CM | POA: Diagnosis not present

## 2018-01-10 DIAGNOSIS — L638 Other alopecia areata: Secondary | ICD-10-CM | POA: Diagnosis not present

## 2018-02-08 DIAGNOSIS — L638 Other alopecia areata: Secondary | ICD-10-CM | POA: Diagnosis not present

## 2018-03-03 ENCOUNTER — Encounter: Payer: Self-pay | Admitting: Podiatry

## 2018-03-03 ENCOUNTER — Ambulatory Visit (INDEPENDENT_AMBULATORY_CARE_PROVIDER_SITE_OTHER): Payer: 59

## 2018-03-03 ENCOUNTER — Ambulatory Visit (INDEPENDENT_AMBULATORY_CARE_PROVIDER_SITE_OTHER): Payer: 59 | Admitting: Podiatry

## 2018-03-03 VITALS — Ht 69.0 in | Wt 148.0 lb

## 2018-03-03 DIAGNOSIS — M792 Neuralgia and neuritis, unspecified: Secondary | ICD-10-CM | POA: Diagnosis not present

## 2018-03-03 DIAGNOSIS — M7750 Other enthesopathy of unspecified foot: Secondary | ICD-10-CM | POA: Diagnosis not present

## 2018-03-03 DIAGNOSIS — M779 Enthesopathy, unspecified: Secondary | ICD-10-CM

## 2018-03-03 DIAGNOSIS — M79674 Pain in right toe(s): Secondary | ICD-10-CM | POA: Diagnosis not present

## 2018-03-03 MED ORDER — MELOXICAM 15 MG PO TABS: 15.0000 mg | ORAL_TABLET | Freq: Every day | ORAL | 0 refills | Status: DC

## 2018-03-03 NOTE — Progress Notes (Signed)
Subjective:    Patient ID: Garrett Carlson, male    DOB: [birth year], 38 y.o.   MRN: [id number]  HPI 38 year old male presents the office today for concerns of pain to the right big toe, pointing the medial aspect which is been ongoing for last 4 months.  He states he gets a thick callus to the area tries to cut it himself.  He reports a burning pain to the side of his toe as well.  He states that the callus has been present for several years and the burning is been ongoing for last 4 months.  Denies any radiating pain up the foot or leg.  He recently has seen orthopedics for pain in the outside aspect of his left foot pointing the fifth metatarsal base.  This is been improving.  Denies any recent injury or trauma.  He is he played sports prior life which may have aggravated the symptoms.  Has no other concerns   Review of Systems  Musculoskeletal: Positive for arthralgias and myalgias.  All other systems reviewed and are negative.  Past Medical History:  Diagnosis Date  . Hx of migraines   . Hx of varicella   . IBS (irritable bowel syndrome)     No past surgical history on file.   Current Outpatient Medications:  .  meloxicam (MOBIC) 15 MG tablet, Take 1 tablet (15 mg total) by mouth daily., Disp: 30 tablet, Rfl: 0  No Known Allergies       Objective:   Physical Exam  General: AAO x3, NAD  Dermatological: Thick hyperkeratotic lesion present on medial aspect the right hallux.  Upon debridement there is no underlying duration drainage or any signs of infection.  There is some other mild hyperkeratotic lesions bilateral feet but they are not causing any pain.  Is no surrounding redness or drainage or any signs of infection to any other areas.  No open sores.  Vascular: Dorsalis Pedis artery and Posterior Tibial artery pedal pulses are 2/4 bilateral with immedate capillary fill time. There is no pain with calf compression, swelling, warmth, erythema.   Neruologic: Grossly  intact via light touch bilateral. Protective threshold with Semmes Wienstein monofilament intact to all pedal sites bilateral.   Musculoskeletal: There is tenderness palpation on the hyperkeratotic lesion right medial hallux.  There was previously some tenderness the fifth metatarsal base along the distal portion of the peroneal tendon given the area he points to however there is no pain to this area today.  Muscular strength 5/5 in all groups tested bilateral.  Gait: Unassisted, Nonantalgic.     Assessment & Plan:  38 year old male with hyperkeratotic lesion right, neuritis -Treatment options discussed including all alternatives, risks, and complications -Etiology of symptoms were discussed -X-rays were obtained and reviewed with the patient.  Small bone spur present off the hallux however there is no evidence of acute fracture or stress fracture. -I do not have the bone spurs causing the issue.  I sharply debrided the hyper avid lesion without any complications or bleeding.  Discussed moisturizer to the area daily.  We discussed shoe modifications and we discussed an over-the-counter insert but he has tried this But Is Not Completely Eliminating Pressure.  I Think That the Reason Why He Is Getting Calluses to Medial Hallux Bilaterally Is Because of the Way He Is Walking.  He Wants to Consider Custom Inserts and he did see Raiford Noble today for this.  -We will continue to monitor the neuritis symptoms.  It could be  due to pressure in the callus as well.  I prescribed meloxicam to help.  He has no radiating pain and he has no other joint issues.  We will continue to monitor.  I think is a localized nerve compression but consider nerve conduction test if needed. -Left foot pain is resolving. Mobic prn.   *When we went back in the room he was using the scalpel to trim his other calluses. I did not do all of them today as he did not ask and they were not causing issues. Recommended moisturizer to the areas  daily.   Vivi BarrackMatthew R Wagoner DPM

## 2018-03-04 ENCOUNTER — Other Ambulatory Visit: Payer: Self-pay | Admitting: Podiatry

## 2018-03-04 ENCOUNTER — Telehealth: Payer: Self-pay | Admitting: *Deleted

## 2018-03-04 DIAGNOSIS — M79674 Pain in right toe(s): Secondary | ICD-10-CM

## 2018-03-04 NOTE — Telephone Encounter (Signed)
Called and spoke with the patient and stated that I was calling to check in on him and he stated that he was doing good and that he was going to get his medicine and I stated that it would take about 3 or 4 days to get into your system and the patient really wanted to see if he could purchase a blade somewhere and I stated that was not a good idea due to infections or the blade breaking off and I stated to the patient the best way to take care of the calluses is to either soak your feet or take a shower and after getting out then use a emery board or a stone and I stated to call the office if any concerns or questions at [phone]. Misty Stanley

## 2018-03-24 ENCOUNTER — Other Ambulatory Visit: Payer: 59 | Admitting: Orthotics

## 2018-03-24 DIAGNOSIS — M779 Enthesopathy, unspecified: Secondary | ICD-10-CM

## 2018-03-24 DIAGNOSIS — M792 Neuralgia and neuritis, unspecified: Secondary | ICD-10-CM

## 2018-03-25 ENCOUNTER — Other Ambulatory Visit: Payer: Self-pay | Admitting: Podiatry

## 2018-04-07 ENCOUNTER — Ambulatory Visit: Payer: 59 | Admitting: Podiatry

## 2018-07-19 ENCOUNTER — Other Ambulatory Visit: Payer: Self-pay | Admitting: Family Medicine

## 2018-07-19 DIAGNOSIS — J309 Allergic rhinitis, unspecified: Secondary | ICD-10-CM

## 2019-09-03 IMAGING — DX DG HAND COMPLETE 3+V*R*
3 series · 3 of 3 positions shown · non-contrast
Comparison: None.

CLINICAL DATA: Polyarthralgia.

EXAM:
RIGHT HAND - COMPLETE 3+ VIEW

[hand pa]
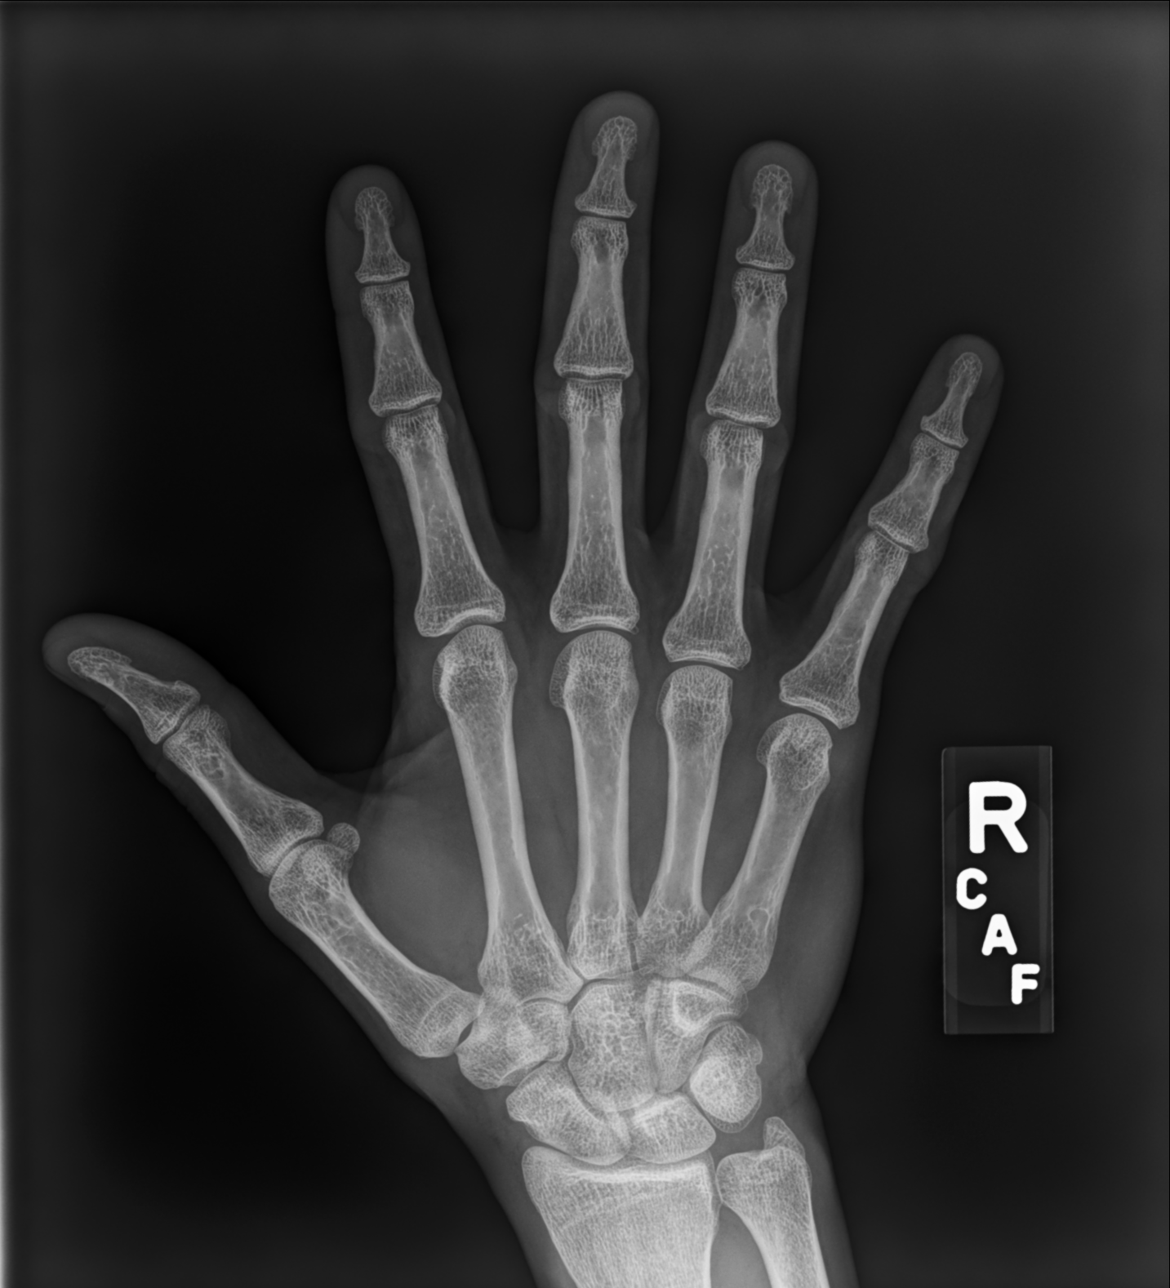

[hand oblique]
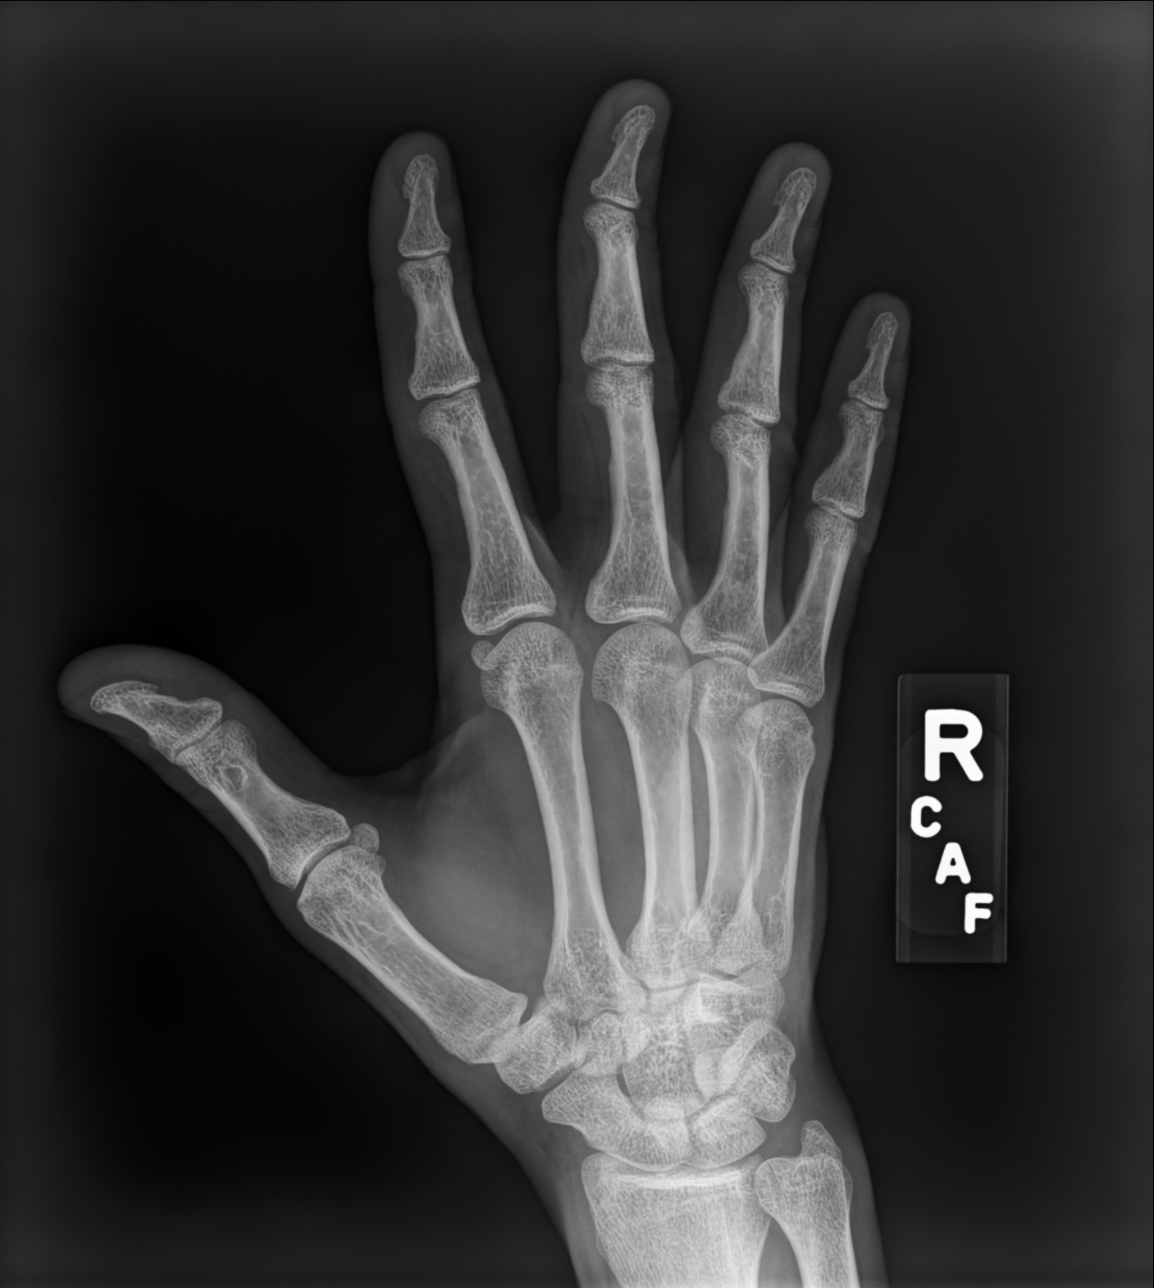

[hand lat]
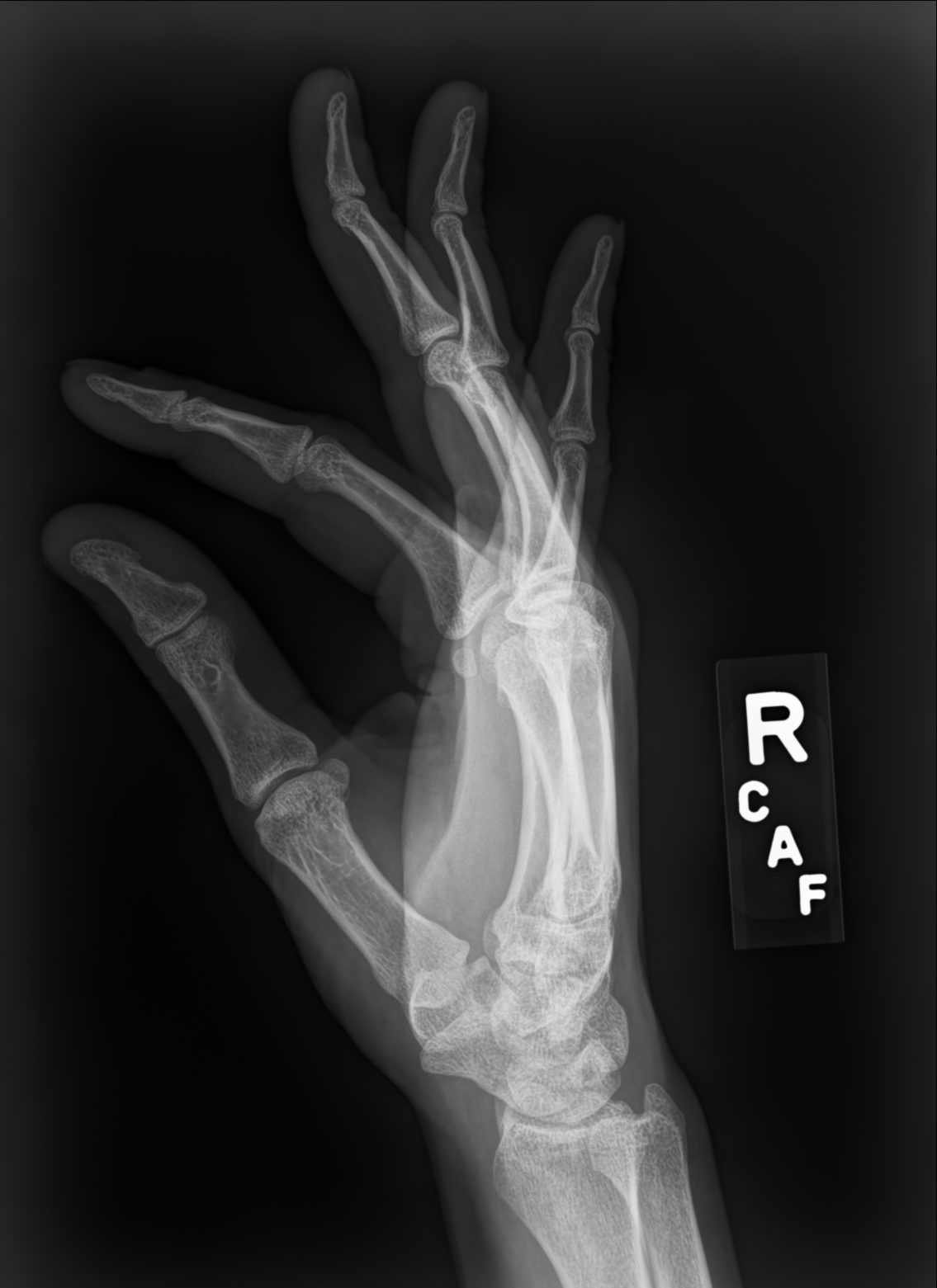

[3 of 3 positions shown; findings below may reference images not displayed]

FINDINGS: There is no evidence of fracture or dislocation. There is no
evidence of arthropathy or other focal bone abnormality. Soft
tissues are unremarkable.
IMPRESSION: Normal right hand.
